# Patient Record
Sex: Male | Born: 2000 | Race: Black or African American | Hispanic: No | Marital: Single | Smoking: Current some day smoker
Health system: Southern US, Community
[De-identification: ages and names within clinical notes are randomized; demographics above are authoritative.]

## PROBLEM LIST (undated history)

## (undated) DIAGNOSIS — R41840 Attention and concentration deficit: Secondary | ICD-10-CM

## (undated) DIAGNOSIS — J45909 Unspecified asthma, uncomplicated: Secondary | ICD-10-CM

## (undated) HISTORY — PX: FOOT SURGERY: SHX648

---
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
## 2023-02-16 ENCOUNTER — Ambulatory Visit
Admission: EM | Admit: 2023-02-16 | Discharge: 2023-02-16 | Disposition: A | Discharge status: Home / Self Care | Payer: Medicaid Other | Attending: Internal Medicine | Admitting: Internal Medicine

## 2023-02-16 ENCOUNTER — Other Ambulatory Visit: Payer: Self-pay

## 2023-02-16 ENCOUNTER — Encounter: Payer: Self-pay | Admitting: Emergency Medicine

## 2023-02-16 DIAGNOSIS — M25571 Pain in right ankle and joints of right foot: Secondary | ICD-10-CM

## 2023-02-16 DIAGNOSIS — M79641 Pain in right hand: Secondary | ICD-10-CM | POA: Diagnosis not present

## 2023-02-16 DIAGNOSIS — M25561 Pain in right knee: Secondary | ICD-10-CM

## 2023-02-16 DIAGNOSIS — M25562 Pain in left knee: Secondary | ICD-10-CM

## 2023-02-16 DIAGNOSIS — T07XXXA Unspecified multiple injuries, initial encounter: Secondary | ICD-10-CM

## 2023-02-16 DIAGNOSIS — M79642 Pain in left hand: Secondary | ICD-10-CM

## 2023-02-16 DIAGNOSIS — Z23 Encounter for immunization: Secondary | ICD-10-CM

## 2023-02-16 DIAGNOSIS — W19XXXA Unspecified fall, initial encounter: Secondary | ICD-10-CM

## 2023-02-16 DIAGNOSIS — M79671 Pain in right foot: Secondary | ICD-10-CM

## 2023-02-16 MED ORDER — TETANUS-DIPHTH-ACELL PERTUSSIS 5-2.5-18.5 LF-MCG/0.5 IM SUSY
0.5000 mL | PREFILLED_SYRINGE | Freq: Once | INTRAMUSCULAR | Status: AC
  Administered 2023-02-16: 0.5 mL via INTRAMUSCULAR

## 2023-02-16 MED ORDER — TETANUS-DIPHTH-ACELL PERTUSSIS 5-2.5-18.5 LF-MCG/0.5 IM SUSY: 0.5000 mL | PREFILLED_SYRINGE | Freq: Once | INTRAMUSCULAR | Status: DC

## 2023-02-16 NOTE — ED Provider Notes (Addendum)
[facility]    CSN: [id number] Arrival date & time: 02/16/23  1213      History   Chief Complaint Chief Complaint  Patient presents with   Fall    HPI Gavin Brown is a 22 y.o. male.   Patient presents with fall that occurred yesterday around 4 PM.  Patient reports that he was walking up concrete stairs when he fell up the stairs.  Denies hitting head or losing consciousness.  Patient reports that he has multiple abrasions to fingers and bilateral knees.  Patient is reporting pain in bilateral hands, bilateral knees, right ankle, right foot.  Patient has not taken any medication for pain.  Denies numbness or tingling.  Patient is not sure of last tetanus vaccine.  Patient also reports that he closed his left fingers in a door about 2 weeks ago and is concerned for this.   Fall    Past Medical History:  Diagnosis Date   Asthma    Attention deficit     There are no problems to display for this patient.   Past Surgical History:  Procedure Laterality Date   FOOT SURGERY     at a few months of age   FOOT SURGERY         Home Medications    Prior to Admission medications   Medication Sig Start Date End Date Taking? Authorizing Provider  cetirizine (ZYRTEC ALLERGY) 10 MG tablet Take 1 tablet (10 mg total) by mouth daily. 10/08/19   Wallis Bamberg, PA-C  ibuprofen (ADVIL) 400 MG tablet Take 1 tablet (400 mg total) by mouth every 6 (six) hours as needed. 05/28/20   Lorin Picket, NP  pseudoephedrine (SUDAFED) 60 MG tablet Take 1 tablet (60 mg total) by mouth every 8 (eight) hours as needed for congestion. 10/08/19   Wallis Bamberg, PA-C  diphenhydrAMINE (BENADRYL) 25 mg capsule Take 1 capsule (25 mg total) by mouth every 6 (six) hours as needed for itching. 07/23/15 08/10/19  Melton Krebs, PA-C    Family History Family History  Problem Relation Age of Onset   Healthy Mother     Social History Social History   Tobacco Use   Smoking status:  Passive Smoke Exposure - Never Smoker   Smokeless tobacco: Never  Vaping Use   Vaping Use: Never used  Substance Use Topics   Alcohol use: No   Drug use: No     Allergies   Patient has no known allergies.   Review of Systems Review of Systems Per HPI  Physical Exam Triage Vital Signs ED Triage Vitals  Enc Vitals Group     BP 02/16/23 1343 120/73     Pulse Rate 02/16/23 1343 60     Resp 02/16/23 1343 18     Temp 02/16/23 1343 98.2 F (36.8 C)     Temp Source 02/16/23 1343 Oral     SpO2 02/16/23 1343 97 %     Weight --      Height --      Head Circumference --      Peak Flow --      Pain Score 02/16/23 1344 5     Pain Loc --      Pain Edu? --      Excl. in GC? --    No data found.  Updated Vital Signs BP 120/73 (BP Location: Left Arm)   Pulse 60   Temp 98.2 F (36.8 C) (Oral)   Resp 18  SpO2 97%   Visual Acuity Right Eye Distance:   Left Eye Distance:   Bilateral Distance:    Right Eye Near:   Left Eye Near:    Bilateral Near:     Physical Exam Constitutional:      General: He is not in acute distress.    Appearance: Normal appearance. He is not toxic-appearing or diaphoretic.  HENT:     Head: Normocephalic and atraumatic.  Eyes:     Extraocular Movements: Extraocular movements intact.     Conjunctiva/sclera: Conjunctivae normal.  Pulmonary:     Effort: Pulmonary effort is normal.  Musculoskeletal:     Comments: Patient has tenderness to palpation to bilateral anterior knees and right upper tib-fib.  Most of the tenderness surrounds small abrasions.  Patient also has tenderness to palpation to right lower lateral ankle that extends slightly into dorsal surface of the foot.  Mild swelling noted in this area.  Patient's bilateral lower extremities are neurovascularly intact.  He has full range of motion of both of them as well.  He can bear weight.  Patient reports tenderness to palpation that is very mild present to the distal ends of the third and  fourth digits of the left hand.  Very minimal swelling noted.  Nails are intact with no subungual hematomas.  Patient has full range of motion of fingers bilaterally.  No tenderness to palpation to right hand.  Neurovascularly intact.  Skin:    Comments: Has very small superficial abrasions scattered throughout bilateral knees, right tib-fib, multiple fingers throughout left and right hands. No bleeding noted.   Neurological:     General: No focal deficit present.     Mental Status: He is alert and oriented to person, place, and time. Mental status is at baseline.  Psychiatric:        Mood and Affect: Mood normal.        Behavior: Behavior normal.        Thought Content: Thought content normal.        Judgment: Judgment normal.      UC Treatments / Results  Labs (all labs ordered are listed, but only abnormal results are displayed) Labs Reviewed - No data to display  EKG   Radiology No results found.  Procedures Procedures (including critical care time)  Medications Ordered in UC Medications  Tdap (BOOSTRIX) injection 0.5 mL (0.5 mLs Intramuscular Given 02/16/23 1402)    Initial Impression / Assessment and Plan / UC Course  I have reviewed the triage vital signs and the nursing notes.  Pertinent labs & imaging results that were available during my care of the patient were reviewed by me and considered in my medical decision making (see chart for details).     Patient was offered x-rays of affected areas of pain but he declined this.  States he will follow-up if pain is persistent.  Therefore, advised patient of supportive care including elevation of extremities and ice application.  Ace wrap applied to right ankle/foot by nursing staff prior to discharge.  Patient to monitor abrasions for signs of infection and to follow-up sooner if these occur.  Tetanus vaccine updated today.  Patient verbalized understanding and was agreeable with plan. Final Clinical Impressions(s) / UC  Diagnoses   Final diagnoses:  Fall, initial encounter  Multiple abrasions  Acute pain of both knees  Bilateral hand pain  Acute right ankle pain  Right foot pain     Discharge Instructions      Please  follow-up with any further concerns.  Elevate extremities and apply ice.  Monitor for signs of infection as we discussed.    ED Prescriptions   None    PDMP not reviewed this encounter.   Gustavus Bryant, [place] 02/16/23 1443    Gustavus Bryant, [place] 02/16/23 1444

## 2023-02-16 NOTE — Discharge Instructions (Signed)
Please follow-up with any further concerns.  Elevate extremities and apply ice.  Monitor for signs of infection as we discussed.

## 2023-02-16 NOTE — ED Triage Notes (Signed)
Pt here for fall up stairs 2 days ago; pt c/o right knee, right foot and bilateral hand abrasions

## 2023-03-30 ENCOUNTER — Emergency Department (HOSPITAL_BASED_OUTPATIENT_CLINIC_OR_DEPARTMENT_OTHER)
Admission: EM | Admit: 2023-03-30 | Discharge: 2023-03-30 | Disposition: A | Discharge status: Home / Self Care | Payer: Medicaid Other | Attending: Emergency Medicine | Admitting: Emergency Medicine

## 2023-03-30 ENCOUNTER — Emergency Department (HOSPITAL_BASED_OUTPATIENT_CLINIC_OR_DEPARTMENT_OTHER): Payer: Medicaid Other | Admitting: Radiology

## 2023-03-30 ENCOUNTER — Encounter (HOSPITAL_BASED_OUTPATIENT_CLINIC_OR_DEPARTMENT_OTHER): Payer: Self-pay

## 2023-03-30 ENCOUNTER — Other Ambulatory Visit: Payer: Self-pay

## 2023-03-30 DIAGNOSIS — R200 Anesthesia of skin: Secondary | ICD-10-CM | POA: Insufficient documentation

## 2023-03-30 DIAGNOSIS — M25561 Pain in right knee: Secondary | ICD-10-CM

## 2023-03-30 NOTE — ED Triage Notes (Signed)
Patient here POV from Home.  Endorses Right Knee Numbness that began 5 Days ago. No Discernable pain. Tender to things such as Hot Water. No Known Trauma or Injury.   NAD Noted during triage. A&Ox4. GCS 15. Ambulatory.

## 2023-03-30 NOTE — ED Provider Notes (Signed)
[facility]avin [facility] EMERGENCY DEPARTMENT AT [facility] Provider Note   CSN: [id number] Arrival date & time: 03/30/23  1137     History  Chief Complaint  Patient presents with   Numbness    Gavin Brown is a 22 y.o. male.  HPI 22 year old male previously healthy presenting for right knee pain and numbness.  He states 5 days ago after recently being at church He developed some numbness and discomfort to the anterior right knee.  No symptoms outside of the knee.  He did not have any trauma, but was quite active and stated pain and numbness are worse with movement.  He also reported some tight shorts around the area which she thought could be contributing.  He has not had any numbness anywhere else including proximal or distal to this.  He has no weakness and ambulates without difficulty.  No rash or skin changes.  Has not had any headache or vision changes.  No history of stroke or other medical problems.     Home Medications Prior to Admission medications   Medication Sig Start Date End Date Taking? Authorizing Provider  cetirizine (ZYRTEC ALLERGY) 10 MG tablet Take 1 tablet (10 mg total) by mouth daily. 10/08/19   Wallis Bamberg, PA-C  ibuprofen (ADVIL) 400 MG tablet Take 1 tablet (400 mg total) by mouth every 6 (six) hours as needed. 05/28/20   Lorin Picket, NP  pseudoephedrine (SUDAFED) 60 MG tablet Take 1 tablet (60 mg total) by mouth every 8 (eight) hours as needed for congestion. 10/08/19   Wallis Bamberg, PA-C  diphenhydrAMINE (BENADRYL) 25 mg capsule Take 1 capsule (25 mg total) by mouth every 6 (six) hours as needed for itching. 07/23/15 08/10/19  Melton Krebs, PA-C      Allergies    Patient has no known allergies.    Review of Systems   Review of Systems Review of systems completed and notable as per HPI.  ROS otherwise negative.   Physical Exam Updated Vital Signs BP 115/87 (BP Location: Right Arm)   Pulse 73   Temp 98.1 F (36.7 C) (Oral)   Resp 17   Ht  5\' 8"  (1.727 m)   Wt 56.7 kg   SpO2 100%   BMI 19.01 kg/m  Physical Exam Vitals and nursing note reviewed.  Constitutional:      General: He is not in acute distress.    Appearance: He is well-developed.  HENT:     Head: Normocephalic and atraumatic.  Eyes:     Conjunctiva/sclera: Conjunctivae normal.  Cardiovascular:     Rate and Rhythm: Normal rate and regular rhythm.     Heart sounds: No murmur heard. Pulmonary:     Effort: Pulmonary effort is normal. No respiratory distress.     Breath sounds: Normal breath sounds.  Abdominal:     Palpations: Abdomen is soft.     Tenderness: There is no abdominal tenderness.  Musculoskeletal:        General: No swelling.     Cervical back: Neck supple.     Comments: Mild tenderness over the anterior knee.  No swelling or skin changes.  No joint laxity.  He has full strength and normal pulses in the bilateral lower extremities.  Skin:    General: Skin is warm and dry.     Capillary Refill: Capillary refill takes less than 2 seconds.  Neurological:     General: No focal deficit present.     Mental Status: He is alert. Mental status  is at baseline.     Cranial Nerves: No cranial nerve deficit.     Sensory: No sensory deficit.     Motor: No weakness.     Coordination: Coordination normal.     Gait: Gait normal.     Deep Tendon Reflexes: Reflexes normal.     Comments: Patient has normal sensation including over area of reported numbness.  He ambulates without difficulty.  He has full strength and normal sensation throughout bilateral lower extremities.  Psychiatric:        Mood and Affect: Mood normal.     ED Results / Procedures / Treatments   Labs (all labs ordered are listed, but only abnormal results are displayed) Labs Reviewed - No data to display  EKG None  Radiology DG Knee Complete 4 Views Right  Result Date: 03/30/2023 CLINICAL DATA:  Right knee pain for 5 days, no known injury EXAM: RIGHT KNEE - COMPLETE 4 VIEW  COMPARISON:  Right knee radiographs December 15, 2016 FINDINGS: No evidence of fracture, dislocation, or joint effusion. No evidence of arthropathy or other focal bone abnormality. Soft tissues are unremarkable. IMPRESSION: Negative. Electronically Signed   By: Jacob Moores M.D.   On: 03/30/2023 12:34    Procedures Procedures    Medications Ordered in ED Medications - No data to display  ED Course/ Medical Decision Making/ A&P                             Medical Decision Making  Medical Decision Making:   Gavin Brown is a 22 y.o. male who presented to the ED today with numbness and pain to the right knee detailed above.     Reviewed and confirmed nursing documentation for past medical history, family history, social history.    Initial Assessment and plan:   Patient presents with some pain and numbness to his right knee for 5 days.  Atraumatic, other associated with tight fitting clothing and walking.  On exam he has completely normal neurologic exam but no deficits including normal sensation over the area.  The distribution, normal sensation, and presence of pain seen very again consistent with central cause of reported numbness.  His symptoms are also improving spontaneously.  I do not think neuroimaging is warranted at this time or neurologic consultation.  I suspect this is more likely muscle skeletal and peripheral in nature.  I reviewed his x-ray, do not see signs of fracture or malalignment.  I discussed this with both patient and family at bedside, and recommend they follow-up closely with her primary care provider.  I given strict return precautions for any new or concerning symptoms.  Discharged in stable condition.  Initial Study Results:    Radiology:  All images reviewed independently. Agree with radiology report at this time.   DG Knee Complete 4 Views Right  Result Date: 03/30/2023 CLINICAL DATA:  Right knee pain for 5 days, no known injury EXAM: RIGHT KNEE - COMPLETE  4 VIEW COMPARISON:  Right knee radiographs December 15, 2016 FINDINGS: No evidence of fracture, dislocation, or joint effusion. No evidence of arthropathy or other focal bone abnormality. Soft tissues are unremarkable. IMPRESSION: Negative. Electronically Signed   By: Jacob Moores M.D.   On: 03/30/2023 12:34      Patient's presentation is most consistent with acute complicated illness / injury requiring diagnostic workup.           Final Clinical Impression(s) / ED Diagnoses Final  diagnoses:  Acute pain of right knee    Rx / DC Orders ED Discharge Orders     None         Laurence Spates, MD 03/30/23 1437

## 2023-03-30 NOTE — ED Notes (Signed)
Patient verbalizes understanding of discharge instructions. Opportunity for questioning and answers were provided. Patient discharged from ED.  °

## 2023-03-30 NOTE — Discharge Instructions (Addendum)
You were seen today for knee pain and numbness.  Your x-ray was reassuring.  I recommend he follow-up closely with your primary care provider.  If you develop worsening numbness, weakness, severe pain or headache or any other new concerning symptoms you should return to the ED.

## 2023-09-08 ENCOUNTER — Ambulatory Visit: Payer: Self-pay
